# Patient Record
Sex: Female | Born: 1985 | Race: Black or African American | Hispanic: No | Marital: Single | Smoking: Never smoker
Health system: Southern US, Community
[De-identification: ages and names within clinical notes are randomized; demographics above are authoritative.]

## PROBLEM LIST (undated history)

## (undated) DIAGNOSIS — R87629 Unspecified abnormal cytological findings in specimens from vagina: Secondary | ICD-10-CM

## (undated) DIAGNOSIS — K219 Gastro-esophageal reflux disease without esophagitis: Secondary | ICD-10-CM

## (undated) DIAGNOSIS — J189 Pneumonia, unspecified organism: Secondary | ICD-10-CM

## (undated) DIAGNOSIS — R519 Headache, unspecified: Secondary | ICD-10-CM

## (undated) HISTORY — DX: Headache, unspecified: R51.9

## (undated) HISTORY — PX: LEEP: SHX91

## (undated) HISTORY — DX: Gastro-esophageal reflux disease without esophagitis: K21.9

## (undated) HISTORY — PX: WISDOM TOOTH EXTRACTION: SHX21

## (undated) HISTORY — PX: TONSILLECTOMY: SUR1361

## (undated) HISTORY — DX: Unspecified abnormal cytological findings in specimens from vagina: R87.629

## (undated) HISTORY — DX: Pneumonia, unspecified organism: J18.9

---
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
## 2004-05-06 ENCOUNTER — Emergency Department (HOSPITAL_COMMUNITY): Admission: EM | Admit: 2004-05-06 | Discharge: 2004-05-06 | Payer: Self-pay | Admitting: Emergency Medicine

## 2004-06-19 ENCOUNTER — Emergency Department (HOSPITAL_COMMUNITY): Admission: EM | Admit: 2004-06-19 | Discharge: 2004-06-19 | Payer: Self-pay | Admitting: Emergency Medicine

## 2004-08-14 ENCOUNTER — Ambulatory Visit: Payer: Self-pay | Admitting: Family Medicine

## 2004-08-14 ENCOUNTER — Ambulatory Visit: Payer: Self-pay | Admitting: *Deleted

## 2004-08-21 ENCOUNTER — Emergency Department (HOSPITAL_COMMUNITY): Admission: EM | Admit: 2004-08-21 | Discharge: 2004-08-21 | Payer: Self-pay | Admitting: Emergency Medicine

## 2004-09-18 ENCOUNTER — Ambulatory Visit: Payer: Self-pay | Admitting: Nurse Practitioner

## 2004-09-25 ENCOUNTER — Ambulatory Visit: Payer: Self-pay | Admitting: Nurse Practitioner

## 2004-09-27 ENCOUNTER — Ambulatory Visit: Payer: Self-pay | Admitting: Nurse Practitioner

## 2004-12-02 ENCOUNTER — Emergency Department (HOSPITAL_COMMUNITY): Admission: EM | Admit: 2004-12-02 | Discharge: 2004-12-02 | Payer: Self-pay | Admitting: Emergency Medicine

## 2004-12-09 ENCOUNTER — Ambulatory Visit: Payer: Self-pay | Admitting: Nurse Practitioner

## 2005-01-24 ENCOUNTER — Ambulatory Visit: Payer: Self-pay | Admitting: Nurse Practitioner

## 2005-02-04 ENCOUNTER — Inpatient Hospital Stay (HOSPITAL_COMMUNITY): Admission: AD | Admit: 2005-02-04 | Discharge: 2005-02-04 | Payer: Self-pay | Admitting: *Deleted

## 2005-02-11 ENCOUNTER — Inpatient Hospital Stay (HOSPITAL_COMMUNITY): Admission: RE | Admit: 2005-02-11 | Discharge: 2005-02-11 | Payer: Self-pay | Admitting: *Deleted

## 2005-02-24 ENCOUNTER — Inpatient Hospital Stay (HOSPITAL_COMMUNITY): Admission: AD | Admit: 2005-02-24 | Discharge: 2005-02-24 | Payer: Self-pay | Admitting: Obstetrics & Gynecology

## 2005-05-03 ENCOUNTER — Inpatient Hospital Stay (HOSPITAL_COMMUNITY): Admission: AD | Admit: 2005-05-03 | Discharge: 2005-05-03 | Payer: Self-pay | Admitting: *Deleted

## 2005-07-09 ENCOUNTER — Other Ambulatory Visit: Admission: RE | Admit: 2005-07-09 | Discharge: 2005-07-09 | Payer: Self-pay | Admitting: Obstetrics and Gynecology

## 2005-08-28 ENCOUNTER — Observation Stay (HOSPITAL_COMMUNITY): Admission: AD | Admit: 2005-08-28 | Discharge: 2005-08-29 | Payer: Self-pay | Admitting: Obstetrics and Gynecology

## 2005-09-24 ENCOUNTER — Inpatient Hospital Stay (HOSPITAL_COMMUNITY): Admission: AD | Admit: 2005-09-24 | Discharge: 2005-09-27 | Payer: Self-pay | Admitting: Obstetrics & Gynecology

## 2006-03-25 ENCOUNTER — Emergency Department (HOSPITAL_COMMUNITY): Admission: EM | Admit: 2006-03-25 | Discharge: 2006-03-25 | Payer: Self-pay | Admitting: Emergency Medicine

## 2006-08-24 ENCOUNTER — Emergency Department (HOSPITAL_COMMUNITY): Admission: EM | Admit: 2006-08-24 | Discharge: 2006-08-24 | Payer: Self-pay | Admitting: Emergency Medicine

## 2007-11-09 ENCOUNTER — Emergency Department (HOSPITAL_COMMUNITY): Admission: EM | Admit: 2007-11-09 | Discharge: 2007-11-09 | Payer: Self-pay | Admitting: Family Medicine

## 2008-03-13 ENCOUNTER — Emergency Department (HOSPITAL_COMMUNITY): Admission: EM | Admit: 2008-03-13 | Discharge: 2008-03-13 | Payer: Self-pay | Admitting: Emergency Medicine

## 2008-04-20 ENCOUNTER — Inpatient Hospital Stay (HOSPITAL_COMMUNITY): Admission: AD | Admit: 2008-04-20 | Discharge: 2008-04-21 | Payer: Self-pay | Admitting: Obstetrics & Gynecology

## 2008-04-28 ENCOUNTER — Inpatient Hospital Stay (HOSPITAL_COMMUNITY): Admission: RE | Admit: 2008-04-28 | Discharge: 2008-04-28 | Payer: Self-pay | Admitting: Obstetrics & Gynecology

## 2009-03-17 ENCOUNTER — Ambulatory Visit: Payer: Self-pay | Admitting: Advanced Practice Midwife

## 2009-03-17 ENCOUNTER — Inpatient Hospital Stay (HOSPITAL_COMMUNITY): Admission: AD | Admit: 2009-03-17 | Discharge: 2009-03-17 | Payer: Self-pay | Admitting: Obstetrics and Gynecology

## 2009-04-08 ENCOUNTER — Emergency Department (HOSPITAL_COMMUNITY): Admission: EM | Admit: 2009-04-08 | Discharge: 2009-04-08 | Payer: Self-pay | Admitting: Family Medicine

## 2009-07-10 ENCOUNTER — Inpatient Hospital Stay (HOSPITAL_COMMUNITY): Admission: AD | Admit: 2009-07-10 | Discharge: 2009-07-10 | Payer: Self-pay | Admitting: Obstetrics & Gynecology

## 2009-11-15 ENCOUNTER — Inpatient Hospital Stay (HOSPITAL_COMMUNITY): Admission: AD | Admit: 2009-11-15 | Discharge: 2009-11-15 | Payer: Self-pay | Admitting: Obstetrics and Gynecology

## 2009-11-15 ENCOUNTER — Ambulatory Visit: Payer: Self-pay | Admitting: Gynecology

## 2009-12-01 ENCOUNTER — Ambulatory Visit: Payer: Self-pay | Admitting: Gynecology

## 2009-12-01 ENCOUNTER — Inpatient Hospital Stay (HOSPITAL_COMMUNITY): Admission: AD | Admit: 2009-12-01 | Discharge: 2009-12-01 | Payer: Self-pay | Admitting: Obstetrics & Gynecology

## 2010-01-15 ENCOUNTER — Inpatient Hospital Stay (HOSPITAL_COMMUNITY): Admission: AD | Admit: 2010-01-15 | Discharge: 2010-01-15 | Payer: Self-pay | Admitting: Obstetrics and Gynecology

## 2010-02-12 ENCOUNTER — Inpatient Hospital Stay (HOSPITAL_COMMUNITY)
Admission: AD | Admit: 2010-02-12 | Discharge: 2010-02-14 | Payer: Self-pay | Source: Home / Self Care | Admitting: Obstetrics & Gynecology

## 2010-02-12 ENCOUNTER — Inpatient Hospital Stay (HOSPITAL_COMMUNITY): Admission: AD | Admit: 2010-02-12 | Discharge: 2010-02-12 | Payer: Self-pay | Admitting: Obstetrics & Gynecology

## 2010-04-20 ENCOUNTER — Encounter: Payer: Self-pay | Admitting: *Deleted

## 2010-06-11 LAB — CBC
HCT: 31.2 % — ABNORMAL LOW (ref 36.0–46.0)
HCT: 38.6 % (ref 36.0–46.0)
MCHC: 32.5 g/dL (ref 30.0–36.0)
MCHC: 33.7 g/dL (ref 30.0–36.0)
MCV: 87.8 fL (ref 78.0–100.0)
Platelets: 197 10*3/uL (ref 150–400)
RBC: 3.55 MIL/uL — ABNORMAL LOW (ref 3.87–5.11)
RDW: 15.2 % (ref 11.5–15.5)
RDW: 15.4 % (ref 11.5–15.5)
WBC: 11.8 10*3/uL — ABNORMAL HIGH (ref 4.0–10.5)

## 2010-06-13 LAB — URINALYSIS, ROUTINE W REFLEX MICROSCOPIC
Bilirubin Urine: NEGATIVE
Ketones, ur: NEGATIVE mg/dL
Nitrite: NEGATIVE
Urobilinogen, UA: 1 mg/dL (ref 0.0–1.0)

## 2010-06-14 LAB — URINALYSIS, ROUTINE W REFLEX MICROSCOPIC
Bilirubin Urine: NEGATIVE
Protein, ur: NEGATIVE mg/dL
Urobilinogen, UA: 0.2 mg/dL (ref 0.0–1.0)

## 2010-06-19 LAB — URINALYSIS, ROUTINE W REFLEX MICROSCOPIC
Bilirubin Urine: NEGATIVE
Glucose, UA: NEGATIVE mg/dL
Ketones, ur: NEGATIVE mg/dL
Nitrite: NEGATIVE
Protein, ur: NEGATIVE mg/dL

## 2010-06-19 LAB — URINE MICROSCOPIC-ADD ON

## 2010-06-19 LAB — WET PREP, GENITAL: Trich, Wet Prep: NONE SEEN

## 2010-07-01 LAB — URINALYSIS, ROUTINE W REFLEX MICROSCOPIC
Bilirubin Urine: NEGATIVE
Glucose, UA: NEGATIVE mg/dL
Hgb urine dipstick: NEGATIVE
Ketones, ur: NEGATIVE mg/dL
Nitrite: NEGATIVE
Specific Gravity, Urine: 1.015 (ref 1.005–1.030)
pH: 6.5 (ref 5.0–8.0)

## 2010-07-01 LAB — WET PREP, GENITAL

## 2010-07-15 LAB — CBC
HCT: 39.4 % (ref 36.0–46.0)
MCHC: 32.5 g/dL (ref 30.0–36.0)
MCV: 88.6 fL (ref 78.0–100.0)
WBC: 10.7 10*3/uL — ABNORMAL HIGH (ref 4.0–10.5)

## 2010-07-15 LAB — WET PREP, GENITAL: Clue Cells Wet Prep HPF POC: NONE SEEN

## 2010-08-16 NOTE — Discharge Summary (Signed)
[name], [name]               ACCOUNT NO.:  [id number]   MEDICAL RECORD NO.:  [id number]          PATIENT TYPE:  OBV   LOCATION:  9196                          FACILITY:  [facility]   PHYSICIAN:  Miguel Wilson, M.D.       DATE OF BIRTH:  22-Feb-1986   DATE OF ADMISSION:  08/28/2005  DATE OF DISCHARGE:  08/29/2005                                 DISCHARGE SUMMARY   FINAL DIAGNOSES:  1.  Intrauterine pregnancy at [redacted] weeks gestation.  2.  Decreased fetal movement.  3.  Shortness of breath.  4.  Preterm uterine contractions.   This 25 year old G1, P0 presents at [redacted] weeks gestation complaining of  decreased fetal movement and shortness of breath.  The patient had oxygen  saturations of 100% on admission.  Her lungs were clear to auscultation.  She was in no apparent distress.  She was having contractions.  Her cervix  was 1 cm dilated, 60% effaced, and -2 station.  She did have a reactive  stress test, was showing contractions about every 5-15 minutes.  She had a  BPP of 8/10, AFI of 15.6.  At this point she was just admitted for  observation for the contractions since the rest of her workup looked good.  She was started on Procardia 10 mg every 6-8 hours.  On the next hospital  day the patient was doing well.  Her contractions were suppressed with the  Procardia, she was having good fetal movement, and she was felt ready for  discharge.  She was sent home on a regular diet, told to decrease her  activities, told to use Procardia 10 mg one t.i.d. or every 8 hours, was to  follow up in our office on September 11, 2005.  Of course, to call with any  increased problems or decreased fetal movement.   LABORATORY ON DISCHARGE:  There were no labs taken but the patient did have  a negative urine upon admission.      Hannah Wilson, P.A.-C.      Miguel Wilson, M.D.  Electronically Signed    MB/MEDQ  D:  09/28/2005  T:  09/29/2005  Job:  [id number]

## 2010-08-27 IMAGING — US US OB TRANSVAGINAL MODIFY
1 series · 14 of 28 positions shown · non-contrast
Comparison: none

CLINICAL DATA: Right lower quadrant pain.  Patient 4 weeks 5 days
pregnant by last menstrual period

OBSTETRIC <14 WK US AND TRANSVAGINAL OB US
TECHNIQUE: Both transabdominal and transvaginal ultrasound
examinations were performed for complete evaluation of the
gestation as well as the maternal uterus, adnexal regions, and
pelvic cul-de-sac.

[Series 1: us ob comp less 14 wks · 14 of 36 slices shown]
[im 2/36]
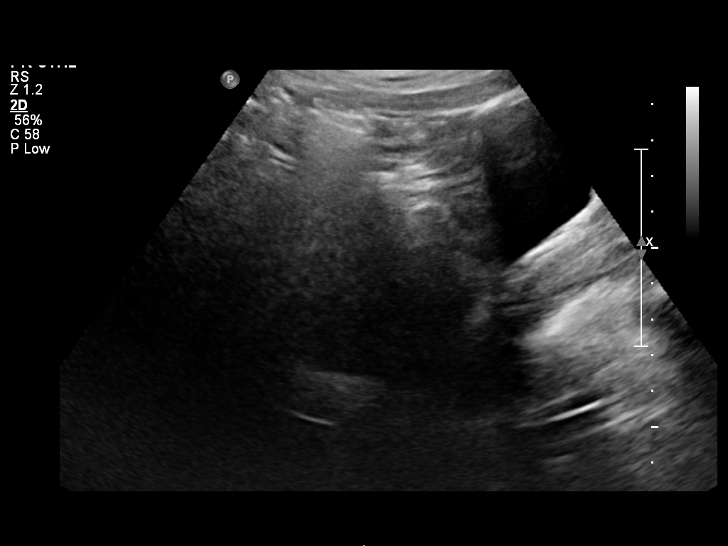
[im 4/36]
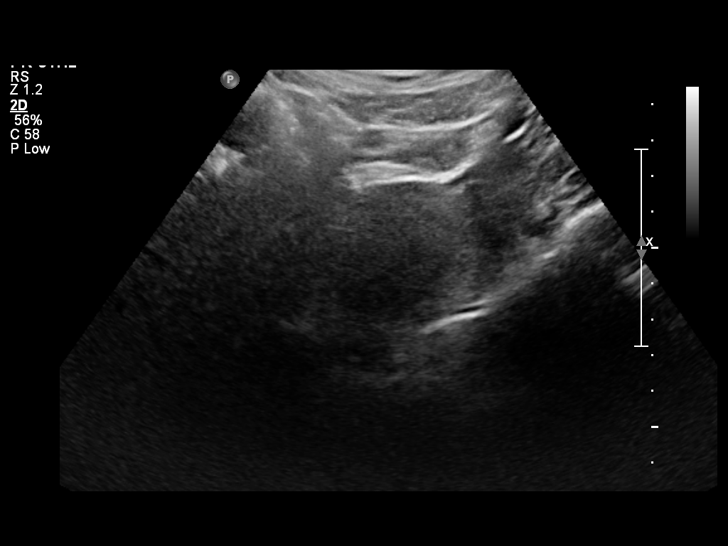
[im 7/36]
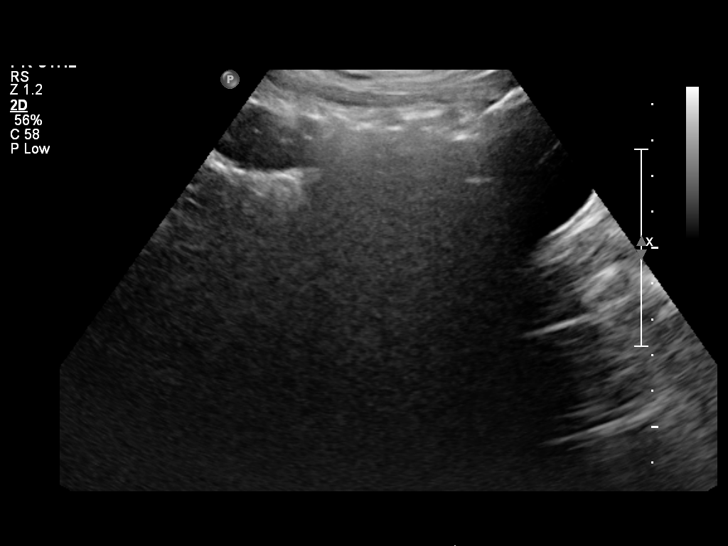
[im 10/36]
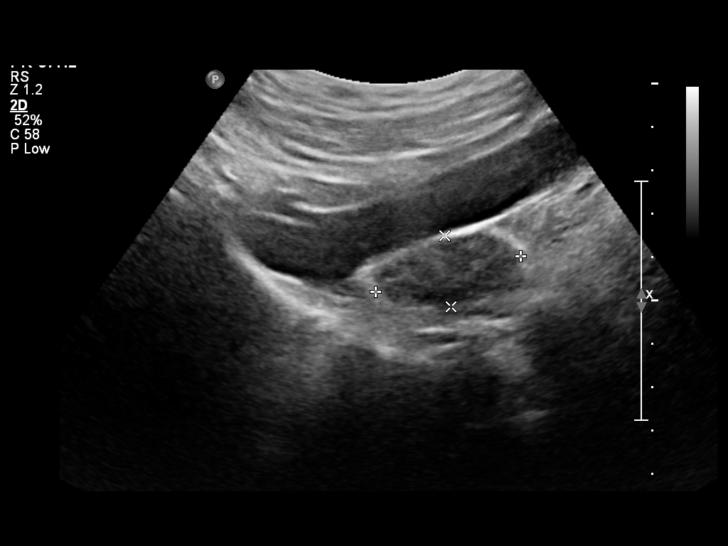
[im 12/36]
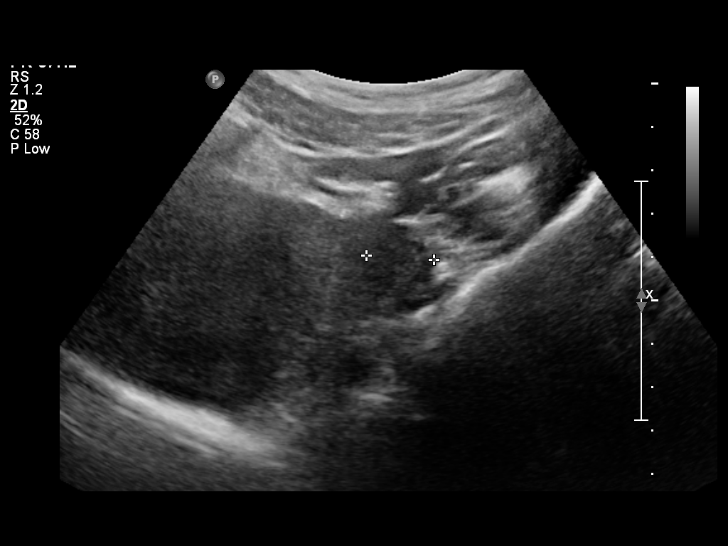
[im 15/36]
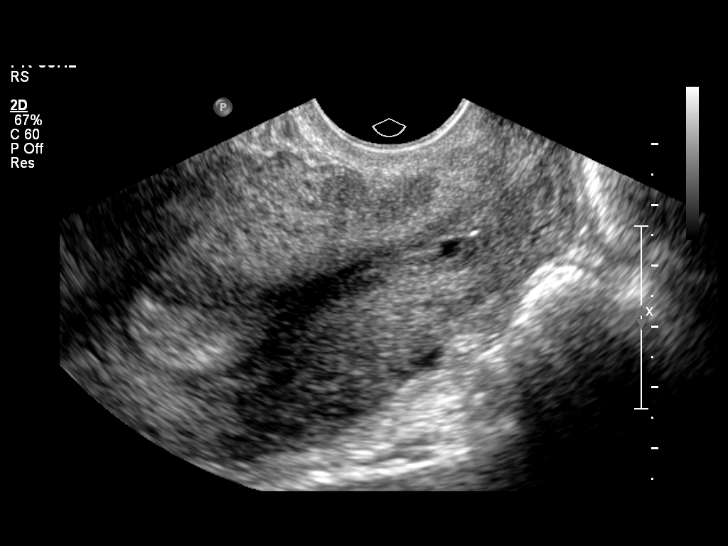
[im 17/36]
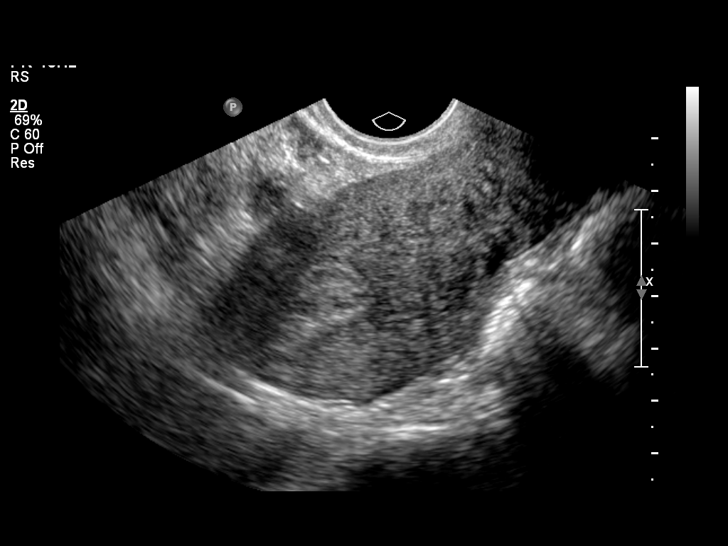
[im 20/36]
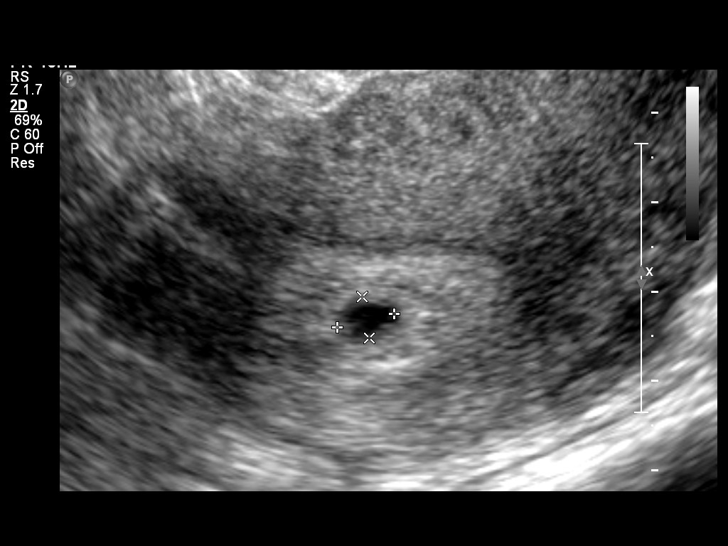
[im 23/36]
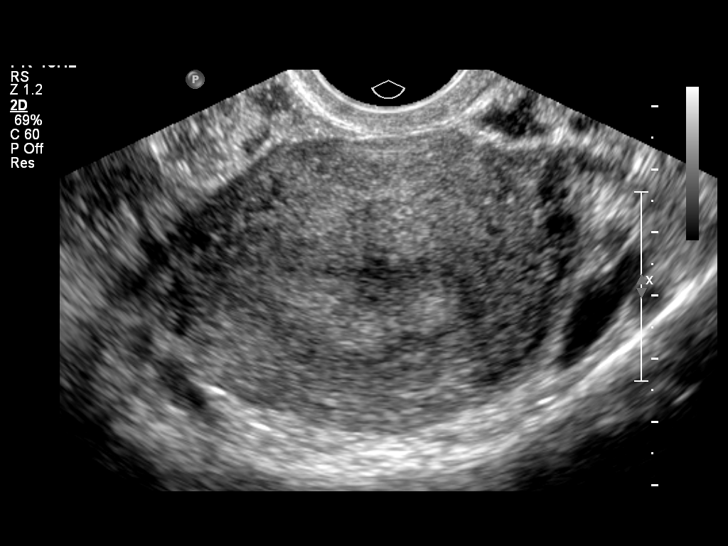
[im 25/36]
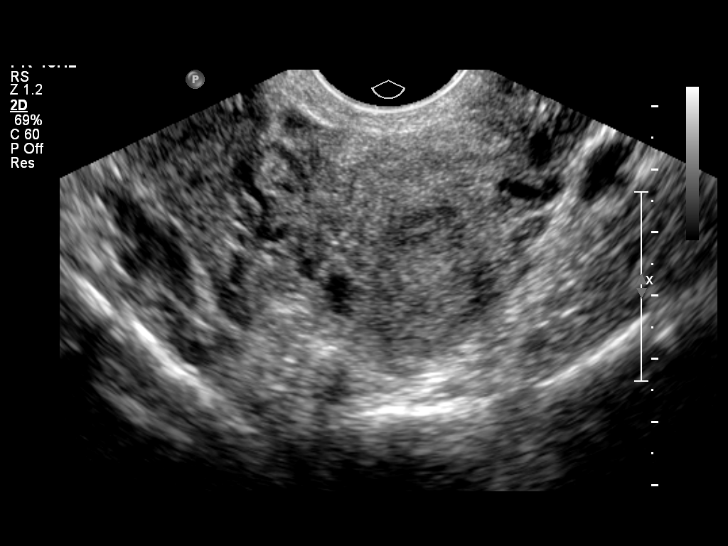
[im 28/36]
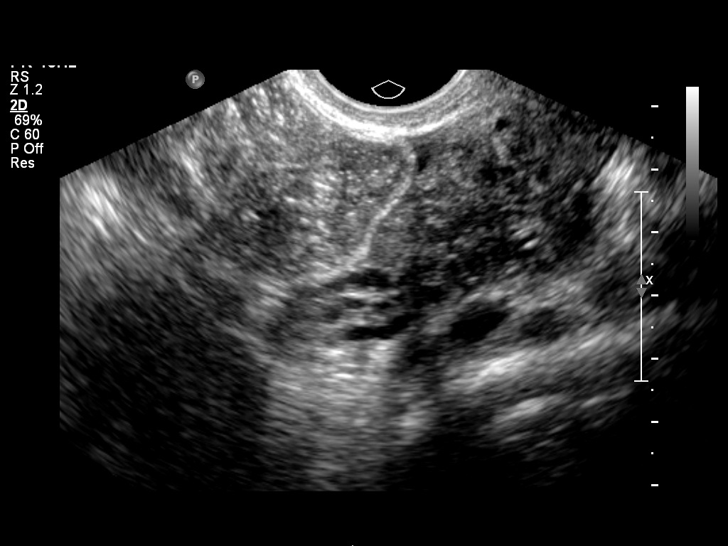
[im 30/36]
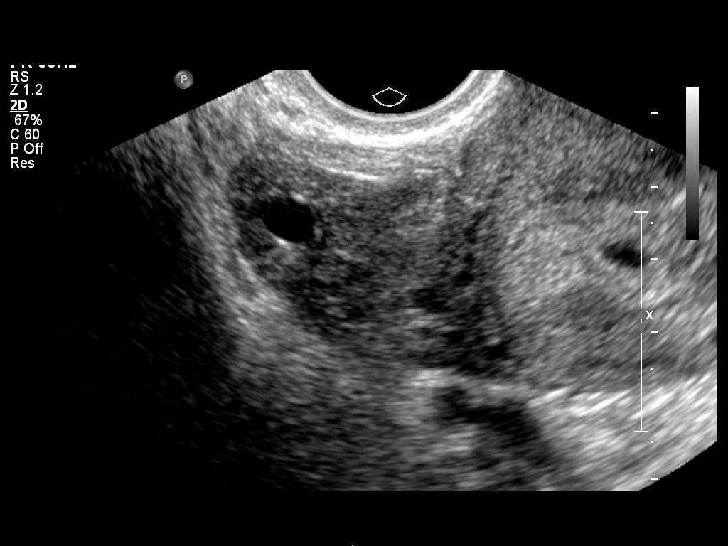
[im 33/36]
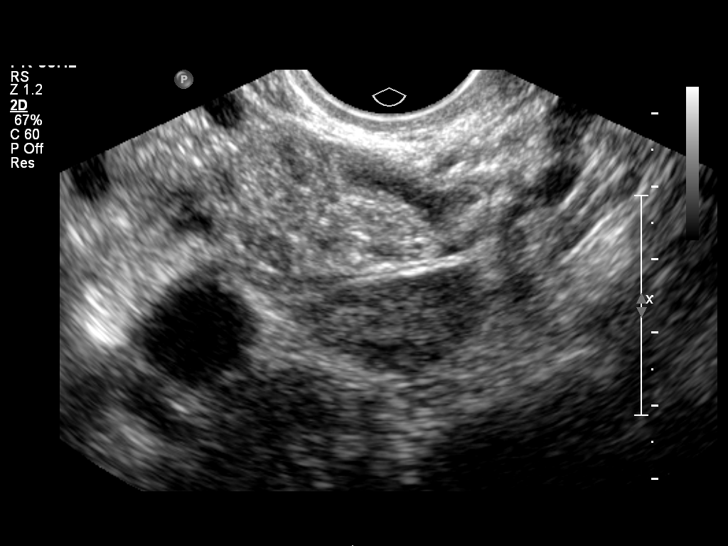
[im 36/36]
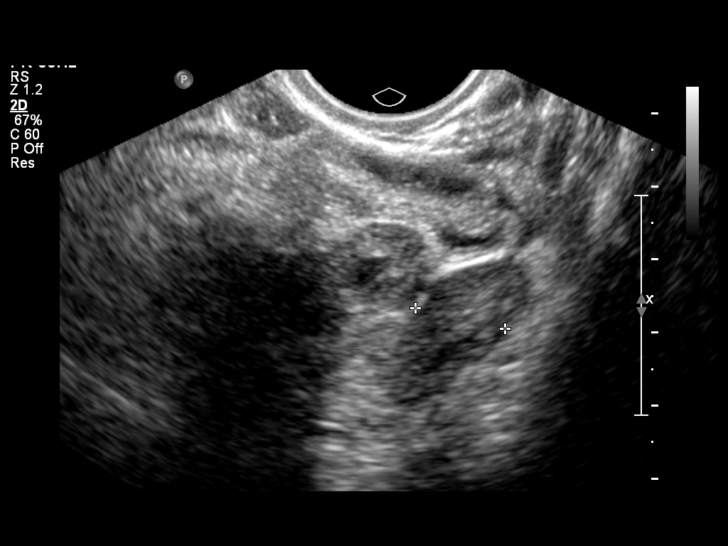

[14 of 28 positions shown; findings below may reference images not displayed]

There is a single small sac within the uterus with a likely
decidual reaction.  This is slightly asymmetric to the endometrial
canal and most consistent with a gestational sac.  No yolk sac or
embryo evident at this stage.

Mean sac diameter equals 5.9 mm for a calculated estimated
gestational age of 5 weeks 1 day.

Right ovary contains a corpus luteal cyst.  Left ovary is normal.
No subchorionic hemorrhage.  No free fluid.
IMPRESSION: 1.  Likely single intrauterine gestational sac.  Differential
diagnosis includes early intrauterine pregnancy, spontaneous
abortion in progress, or less likely pseudocyst  of ectopic
pregnancy.  Recommend follow up imaging and following beta HCGs.

2. .  Estimated gestational age by mean sac diameter equals 5 weeks
1 day.

## 2010-12-27 LAB — POCT URINALYSIS DIP (DEVICE)
Glucose, UA: NEGATIVE
Nitrite: NEGATIVE
Operator id: 29721
Protein, ur: NEGATIVE
Urobilinogen, UA: 0.2

## 2010-12-27 LAB — WET PREP, GENITAL

## 2010-12-27 LAB — GC/CHLAMYDIA PROBE AMP, GENITAL
Chlamydia, DNA Probe: NEGATIVE
GC Probe Amp, Genital: NEGATIVE

## 2011-07-31 ENCOUNTER — Encounter (HOSPITAL_COMMUNITY): Payer: Self-pay | Admitting: Emergency Medicine

## 2011-07-31 ENCOUNTER — Emergency Department (INDEPENDENT_AMBULATORY_CARE_PROVIDER_SITE_OTHER)
Admission: EM | Admit: 2011-07-31 | Discharge: 2011-07-31 | Disposition: A | Discharge status: Home / Self Care | Payer: Self-pay | Source: Home / Self Care | Attending: Emergency Medicine | Admitting: Emergency Medicine

## 2011-07-31 DIAGNOSIS — J40 Bronchitis, not specified as acute or chronic: Secondary | ICD-10-CM

## 2011-07-31 MED ORDER — AZITHROMYCIN 250 MG PO TABS: 250.0000 mg | ORAL_TABLET | Freq: Every day | ORAL | Status: AC

## 2011-07-31 MED ORDER — GUAIFENESIN-CODEINE 100-10 MG/5ML PO SYRP: 5.0000 mL | ORAL_SOLUTION | Freq: Three times a day (TID) | ORAL | Status: AC | PRN

## 2011-07-31 NOTE — ED Provider Notes (Signed)
History     CSN: [id number]  Arrival date & time 07/31/11  1036   First MD Initiated Contact with Patient 07/31/11 1207      Chief Complaint  Patient presents with  . URI    (Consider location/radiation/quality/duration/timing/severity/associated sxs/prior treatment) HPI Comments: For about 2 weeks been trying to  Shake this off" , have been c, coughing and congested for almost 2 weeks, my daughter also with an respiratory infection, has a product ecough  Patient is a 26 y.o. female presenting with URI. The history is provided by the patient.  URI The primary symptoms include fever, sore throat and cough. Primary symptoms do not include vomiting or rash. The current episode started more than 1 week ago. The problem has not changed since onset. The illness is not associated with chills, congestion or rhinorrhea.    History reviewed. No pertinent past medical history.  Past Surgical History  Procedure Date  . Tonsillectomy   . Leep     No family history on file.  History  Substance Use Topics  . Smoking status: Never Smoker   . Smokeless tobacco: Not on file  . Alcohol Use: No    OB History    Grav Para Term Preterm Abortions TAB SAB Ect Mult Living                  Review of Systems  Constitutional: Positive for fever. Negative for chills.  HENT: Positive for sore throat. Negative for congestion and rhinorrhea.   Respiratory: Positive for cough. Negative for shortness of breath.   Gastrointestinal: Negative for vomiting.  Skin: Negative for rash.    Allergies  Penicillins  Home Medications   Current Outpatient Rx  Name Route Sig Dispense Refill  . AZITHROMYCIN 250 MG PO TABS Oral Take 1 tablet (250 mg total) by mouth daily. Take first 2 tablets together, then 1 every day until finished. 6 tablet 0  . GUAIFENESIN-CODEINE 100-10 MG/5ML PO SYRP Oral Take 5 mLs by mouth 3 (three) times daily as needed for cough. 120 mL 0    BP 113/62  Pulse 109  Temp(Src)  98.8 F (37.1 C) (Oral)  Resp 20  SpO2 100%  LMP 06/16/2011  Physical Exam  Nursing note and vitals reviewed. Constitutional: She appears well-developed and well-nourished.  Non-toxic appearance. She does not have a sickly appearance. She does not appear ill. No distress.  HENT:  Head: Normocephalic.  Nose: Nose normal.  Mouth/Throat: No oropharyngeal exudate.  Neck: Neck supple.  Pulmonary/Chest: Effort normal.  Skin: No rash noted.    ED Course  Procedures (including critical care time)  Labs Reviewed - No data to display No results found.   1. Bronchitis       MDM  Afebrile, looks comfortable in no respiratory  Distress, encouraged in 2-3 days if no importance        Jimmie Molly, MD 07/31/11 [phone]

## 2011-07-31 NOTE — Discharge Instructions (Signed)
Bronchitis Bronchitis is a problem of the air tubes leading to your lungs. This problem makes it hard for air to get in and out of the lungs. You may cough a lot because your air tubes are narrow. Going without care can cause lasting (chronic) bronchitis. HOME CARE   Drink enough fluids to keep your pee (urine) clear or pale yellow.   Use a cool mist humidifier.   Quit smoking if you smoke. If you keep smoking, the bronchitis might not get better.   Only take medicine as told by your doctor.  GET HELP RIGHT AWAY IF:   Coughing keeps you awake.   You start to wheeze.   You become more sick or weak.   You have a hard time breathing or get short of breath.   You cough up blood.   Coughing lasts more than 2 weeks.   You have a fever.   Your baby is older than 3 months with a rectal temperature of 102 F (38.9 C) or higher.   Your baby is 3 months old or younger with a rectal temperature of 100.4 F (38 C) or higher.  MAKE SURE YOU:  Understand these instructions.   Will watch your condition.   Will get help right away if you are not doing well or get worse.  Document Released: 09/03/2007 Document Revised: 03/06/2011 Document Reviewed: 02/16/2009 Exit[employer] Patient Information 2012 [employer]. 

## 2011-07-31 NOTE — ED Notes (Signed)
Vitals obtained by students 

## 2011-07-31 NOTE — ED Notes (Signed)
Sore throat, body aches, cough, runny nose, green-yellow congestion.  Inially symptoms onset 2 weeks ago, and some symptoms improved, sore throat has reoccurred this am.  Reports temp 99.6 this am.

## 2021-03-31 NOTE — L&D Delivery Note (Signed)
Called to bedside due to fetal bradycardia. Upon my arrival fhr in the 70-80's with contractions return to baseline 110's .Marland Kitchen Pt is complete and +2 Station. D/W patient option of vacuum assisted delivery vs cesarean section.  Risk of vacuum delivery to include but not limited to laceration to fetal head. Hematoma formation, r/o shoulder dystocia. R/o pop offs without delivery necessitating cesarean delivery. Pt desired to proceed with delivery via vacuum extraction.  Kiwi vacuum extractor was used . During contraction pressure was applied to the green zone. While the patient was pushing traction was applied. The suction on the vacuum was released between contractions. The fetal head was successfully delivered within 3 contractions without any pop offs.   Nuchal cord x 1 was reduced. The rest of the fetal body was delivered. Vigorous cry was noted. The newborn was placed on Moms chest. Delayed cord clamping was performed. The remainder of the delivery was performed by Rhea Pink CNM. ( See separate delivery note

## 2021-03-31 NOTE — L&D Delivery Note (Signed)
Delivery Note Labor onset: 11/01/2021  Labor Onset Time: 1639 Complete dilation at  1931 Onset of pushing at 1931 FHR second stage Cat 3 Analgesia/Anesthesia intrapartum: Epidural  Variable decels to 70's with return to baseline with position change converted to terminal bradycardia. Strong maternal pushing effort resulting in fetal decent from -1 to +2 station. Bradycardia continues despite effective intrauterine resuscitation. Dr. Richardson Dopp notified for assistance. MD to bedside, pt consented for operative vaginal delivery via vacuum extraction or primary cesarean birth. Pt consents to vacuum assistance. Kiwi vacuum applied by Dr. Richardson Dopp (see MD note for details). No pop-offs. Delivery of a viable female at 30.   Nuchal cord: tight, reduced on perineum.  Infant placed on maternal abd, dried, and tactile stim.  Cord double clamped after 1 min and cut by Vaughan Basta, father.  RN  present for birth.  Cord blood sample collected: Yes Arterial cord blood sample collected: Yes 7.21  Placenta delivered Tomasa Blase, intact, with 3 VC absent of Wharton's jelly.  Placenta to L&D. Uterine tone firm, bleeding scant  No laceration identified.  QBL (mL): 102 Complications: terminal bradycardia APGAR: APGAR (1 MIN):  8 APGAR (5 MINS):  9 APGAR (10 MINS):   Mom to postpartum.  Baby to Couplet care / Skin to Skin. Baby boy "Hannah Wilson" Circumcision Yes  Roma Schanz DNP, CNM 11/01/2021, 8:03 PM

## 2021-04-29 LAB — OB RESULTS CONSOLE HEPATITIS B SURFACE ANTIGEN: Hepatitis B Surface Ag: NEGATIVE

## 2021-04-29 LAB — OB RESULTS CONSOLE RUBELLA ANTIBODY, IGM: Rubella: IMMUNE

## 2021-04-29 LAB — OB RESULTS CONSOLE GC/CHLAMYDIA
Chlamydia: NEGATIVE
Neisseria Gonorrhea: NEGATIVE

## 2021-04-29 LAB — OB RESULTS CONSOLE HIV ANTIBODY (ROUTINE TESTING): HIV: NONREACTIVE

## 2021-07-08 ENCOUNTER — Other Ambulatory Visit: Payer: Self-pay | Admitting: Obstetrics & Gynecology

## 2021-07-08 ENCOUNTER — Other Ambulatory Visit: Payer: Self-pay | Admitting: Obstetrics and Gynecology

## 2021-07-08 DIAGNOSIS — Z3689 Encounter for other specified antenatal screening: Secondary | ICD-10-CM

## 2021-07-22 ENCOUNTER — Encounter: Payer: Self-pay | Admitting: *Deleted

## 2021-07-26 ENCOUNTER — Ambulatory Visit: Payer: Managed Care, Other (non HMO) | Admitting: *Deleted

## 2021-07-26 ENCOUNTER — Ambulatory Visit: Payer: Self-pay

## 2021-07-26 ENCOUNTER — Other Ambulatory Visit: Payer: Self-pay | Admitting: Obstetrics and Gynecology

## 2021-07-26 ENCOUNTER — Ambulatory Visit (HOSPITAL_BASED_OUTPATIENT_CLINIC_OR_DEPARTMENT_OTHER): Payer: Managed Care, Other (non HMO) | Admitting: Obstetrics

## 2021-07-26 ENCOUNTER — Ambulatory Visit: Payer: Managed Care, Other (non HMO) | Attending: Obstetrics and Gynecology

## 2021-07-26 ENCOUNTER — Other Ambulatory Visit: Payer: Self-pay | Admitting: *Deleted

## 2021-07-26 VITALS — BP 108/71 | HR 106 | Ht 63.0 in

## 2021-07-26 DIAGNOSIS — O09522 Supervision of elderly multigravida, second trimester: Secondary | ICD-10-CM

## 2021-07-26 DIAGNOSIS — Z3689 Encounter for other specified antenatal screening: Secondary | ICD-10-CM

## 2021-07-26 DIAGNOSIS — Z3A24 24 weeks gestation of pregnancy: Secondary | ICD-10-CM | POA: Diagnosis not present

## 2021-07-26 DIAGNOSIS — Z9889 Other specified postprocedural states: Secondary | ICD-10-CM | POA: Diagnosis present

## 2021-07-26 DIAGNOSIS — O4442 Low lying placenta NOS or without hemorrhage, second trimester: Secondary | ICD-10-CM | POA: Diagnosis not present

## 2021-07-26 DIAGNOSIS — O4440 Low lying placenta NOS or without hemorrhage, unspecified trimester: Secondary | ICD-10-CM

## 2021-07-26 NOTE — Progress Notes (Signed)
MFM Note  Hannah Wilson was seen for a detailed fetal anatomy scan due to advanced maternal age.  The views of the fetal anatomy were unable to be fully visualized during her recent ultrasound in your office.  A low-lying placenta was also noted during that exam.  She denies any significant past medical history and denies any problems in her current pregnancy.    She had a cell free DNA test earlier in her pregnancy which indicated a low risk for trisomy 73, 9, and 13. A female fetus is predicted.  She was informed that the fetal growth and amniotic fluid level were appropriate for her gestational age.  The fetal biometry measurements obtained today are consistent with an [facility] of November 12, 2021.  There were no obvious fetal anomalies noted on today's ultrasound exam.  The patient was informed that anomalies may be missed due to technical limitations. If the fetus is in a suboptimal position or maternal habitus is increased, visualization of the fetus in the maternal uterus may be impaired.  A low-lying placenta measuring 1.9 cm away from the internal cervical os was noted today.  The patient was reassured that the placenta will most likely move away from the cervix later in her pregnancy.  She should be able to attempt a vaginal delivery at term.  The increased risk of fetal aneuploidy due to advanced maternal age was discussed. Due to advanced maternal age, the patient was offered and declined an amniocentesis today for definitive diagnosis of fetal aneuploidy.  She is comfortable with her negative cell free DNA test.  A follow-up exam was scheduled in 6 weeks to assess the placental location and the fetal growth.    The patient stated that all of her questions were answered.  A total of 30 minutes was spent counseling and coordinating the care for this patient.  Greater than 50% of the time was spent in direct face-to-face contact.

## 2021-08-23 LAB — OB RESULTS CONSOLE RPR: RPR: NONREACTIVE

## 2021-09-04 ENCOUNTER — Ambulatory Visit: Payer: Managed Care, Other (non HMO) | Admitting: *Deleted

## 2021-09-04 ENCOUNTER — Ambulatory Visit: Payer: Managed Care, Other (non HMO) | Attending: Obstetrics

## 2021-09-04 VITALS — BP 120/72 | HR 105

## 2021-09-04 DIAGNOSIS — O4440 Low lying placenta NOS or without hemorrhage, unspecified trimester: Secondary | ICD-10-CM | POA: Diagnosis present

## 2021-09-04 DIAGNOSIS — O09523 Supervision of elderly multigravida, third trimester: Secondary | ICD-10-CM | POA: Diagnosis not present

## 2021-09-04 DIAGNOSIS — Z3A30 30 weeks gestation of pregnancy: Secondary | ICD-10-CM | POA: Diagnosis not present

## 2021-11-01 ENCOUNTER — Inpatient Hospital Stay (HOSPITAL_COMMUNITY)
Admission: AD | Admit: 2021-11-01 | Discharge: 2021-11-03 | DRG: 807 | Disposition: A | Discharge status: Home / Self Care | Payer: Managed Care, Other (non HMO) | Attending: Obstetrics and Gynecology | Admitting: Obstetrics and Gynecology

## 2021-11-01 ENCOUNTER — Inpatient Hospital Stay (HOSPITAL_COMMUNITY): Payer: Managed Care, Other (non HMO) | Admitting: Anesthesiology

## 2021-11-01 ENCOUNTER — Encounter (HOSPITAL_COMMUNITY): Payer: Self-pay | Admitting: Obstetrics & Gynecology

## 2021-11-01 ENCOUNTER — Other Ambulatory Visit: Payer: Self-pay

## 2021-11-01 DIAGNOSIS — Z880 Allergy status to penicillin: Secondary | ICD-10-CM | POA: Diagnosis not present

## 2021-11-01 DIAGNOSIS — O99824 Streptococcus B carrier state complicating childbirth: Secondary | ICD-10-CM | POA: Diagnosis present

## 2021-11-01 DIAGNOSIS — O4290 Premature rupture of membranes, unspecified as to length of time between rupture and onset of labor, unspecified weeks of gestation: Secondary | ICD-10-CM

## 2021-11-01 DIAGNOSIS — Z3A38 38 weeks gestation of pregnancy: Secondary | ICD-10-CM

## 2021-11-01 DIAGNOSIS — O4292 Full-term premature rupture of membranes, unspecified as to length of time between rupture and onset of labor: Principal | ICD-10-CM | POA: Diagnosis present

## 2021-11-01 LAB — CBC
HCT: 36.9 % (ref 36.0–46.0)
Hemoglobin: 11.6 g/dL — ABNORMAL LOW (ref 12.0–15.0)
MCH: 26.2 pg (ref 26.0–34.0)
MCHC: 31.4 g/dL (ref 30.0–36.0)
MCV: 83.3 fL (ref 80.0–100.0)
Platelets: 217 10*3/uL (ref 150–400)
RBC: 4.43 MIL/uL (ref 3.87–5.11)
RDW: 15.7 % — ABNORMAL HIGH (ref 11.5–15.5)
WBC: 9.1 10*3/uL (ref 4.0–10.5)
nRBC: 0 % (ref 0.0–0.2)

## 2021-11-01 LAB — TYPE AND SCREEN
ABO/RH(D): A POS
Antibody Screen: NEGATIVE

## 2021-11-01 LAB — OB RESULTS CONSOLE GBS: GBS: POSITIVE

## 2021-11-01 LAB — POCT FERN TEST: POCT Fern Test: POSITIVE

## 2021-11-01 MED ORDER — DIPHENHYDRAMINE HCL 50 MG/ML IJ SOLN: 12.5000 mg | INTRAMUSCULAR | Status: DC | PRN

## 2021-11-01 MED ORDER — OXYCODONE-ACETAMINOPHEN 5-325 MG PO TABS: 2.0000 | ORAL_TABLET | ORAL | Status: DC | PRN

## 2021-11-01 MED ORDER — LIDOCAINE HCL (PF) 1 % IJ SOLN
INTRAMUSCULAR | Status: DC | PRN
  Administered 2021-11-01: 10 mL via EPIDURAL

## 2021-11-01 MED ORDER — WITCH HAZEL-GLYCERIN EX PADS: 1.0000 | MEDICATED_PAD | CUTANEOUS | Status: DC | PRN

## 2021-11-01 MED ORDER — SENNOSIDES-DOCUSATE SODIUM 8.6-50 MG PO TABS
2.0000 | ORAL_TABLET | ORAL | Status: DC
  Administered 2021-11-02 – 2021-11-03 (×2): 2 via ORAL
  Filled 2021-11-01 (×2): qty 2

## 2021-11-01 MED ORDER — FENTANYL CITRATE (PF) 100 MCG/2ML IJ SOLN
50.0000 ug | INTRAMUSCULAR | Status: DC | PRN
  Administered 2021-11-01: 50 ug via INTRAVENOUS
  Filled 2021-11-01: qty 2

## 2021-11-01 MED ORDER — ACETAMINOPHEN 325 MG PO TABS
650.0000 mg | ORAL_TABLET | ORAL | Status: DC | PRN
Start: 2021-11-01 — End: 2021-11-01

## 2021-11-01 MED ORDER — ZOLPIDEM TARTRATE 5 MG PO TABS: 5.0000 mg | ORAL_TABLET | Freq: Every evening | ORAL | Status: DC | PRN

## 2021-11-01 MED ORDER — VANCOMYCIN HCL IN DEXTROSE 1-5 GM/200ML-% IV SOLN
1000.0000 mg | Freq: Two times a day (BID) | INTRAVENOUS | Status: DC
Start: 2021-11-01 — End: 2021-11-01
  Administered 2021-11-01: 1000 mg via INTRAVENOUS
  Filled 2021-11-01 (×2): qty 200

## 2021-11-01 MED ORDER — PHENYLEPHRINE 80 MCG/ML (10ML) SYRINGE FOR IV PUSH (FOR BLOOD PRESSURE SUPPORT)
80.0000 ug | PREFILLED_SYRINGE | INTRAVENOUS | Status: DC | PRN
  Filled 2021-11-01: qty 10

## 2021-11-01 MED ORDER — SOD CITRATE-CITRIC ACID 500-334 MG/5ML PO SOLN: 30.0000 mL | ORAL | Status: DC | PRN

## 2021-11-01 MED ORDER — PRENATAL MULTIVITAMIN CH
1.0000 | ORAL_TABLET | Freq: Every day | ORAL | Status: DC
  Administered 2021-11-02: 1 via ORAL
  Filled 2021-11-01: qty 1

## 2021-11-01 MED ORDER — LACTATED RINGERS IV SOLN: INTRAVENOUS | Status: DC

## 2021-11-01 MED ORDER — LACTATED RINGERS IV SOLN
500.0000 mL | INTRAVENOUS | Status: DC | PRN
  Administered 2021-11-01: 1000 mL via INTRAVENOUS

## 2021-11-01 MED ORDER — DIBUCAINE (PERIANAL) 1 % EX OINT: 1.0000 | TOPICAL_OINTMENT | CUTANEOUS | Status: DC | PRN

## 2021-11-01 MED ORDER — IBUPROFEN 600 MG PO TABS
600.0000 mg | ORAL_TABLET | Freq: Four times a day (QID) | ORAL | Status: DC
  Administered 2021-11-01 – 2021-11-03 (×6): 600 mg via ORAL
  Filled 2021-11-01 (×6): qty 1

## 2021-11-01 MED ORDER — ONDANSETRON HCL 4 MG/2ML IJ SOLN
4.0000 mg | Freq: Four times a day (QID) | INTRAMUSCULAR | Status: DC | PRN
  Administered 2021-11-01: 4 mg via INTRAVENOUS
  Filled 2021-11-01: qty 2

## 2021-11-01 MED ORDER — LIDOCAINE HCL (PF) 1 % IJ SOLN: 30.0000 mL | INTRAMUSCULAR | Status: DC | PRN

## 2021-11-01 MED ORDER — DIPHENHYDRAMINE HCL 25 MG PO CAPS: 25.0000 mg | ORAL_CAPSULE | Freq: Four times a day (QID) | ORAL | Status: DC | PRN

## 2021-11-01 MED ORDER — LACTATED RINGERS IV SOLN: 500.0000 mL | Freq: Once | INTRAVENOUS | Status: DC

## 2021-11-01 MED ORDER — PHENYLEPHRINE 80 MCG/ML (10ML) SYRINGE FOR IV PUSH (FOR BLOOD PRESSURE SUPPORT): 80.0000 ug | PREFILLED_SYRINGE | INTRAVENOUS | Status: DC | PRN

## 2021-11-01 MED ORDER — FENTANYL-BUPIVACAINE-NACL 0.5-0.125-0.9 MG/250ML-% EP SOLN
12.0000 mL/h | EPIDURAL | Status: DC | PRN
  Administered 2021-11-01: 12 mL/h via EPIDURAL
  Filled 2021-11-01: qty 250

## 2021-11-01 MED ORDER — ONDANSETRON HCL 4 MG PO TABS: 4.0000 mg | ORAL_TABLET | ORAL | Status: DC | PRN

## 2021-11-01 MED ORDER — ACETAMINOPHEN 325 MG PO TABS: 650.0000 mg | ORAL_TABLET | ORAL | Status: DC | PRN

## 2021-11-01 MED ORDER — EPHEDRINE 5 MG/ML INJ: 10.0000 mg | INTRAVENOUS | Status: DC | PRN

## 2021-11-01 MED ORDER — CLINDAMYCIN PHOSPHATE 900 MG/50ML IV SOLN: 900.0000 mg | Freq: Three times a day (TID) | INTRAVENOUS | Status: DC

## 2021-11-01 MED ORDER — ONDANSETRON HCL 4 MG/2ML IJ SOLN: 4.0000 mg | INTRAMUSCULAR | Status: DC | PRN

## 2021-11-01 MED ORDER — SIMETHICONE 80 MG PO CHEW: 80.0000 mg | CHEWABLE_TABLET | ORAL | Status: DC | PRN

## 2021-11-01 MED ORDER — TETANUS-DIPHTH-ACELL PERTUSSIS 5-2.5-18.5 LF-MCG/0.5 IM SUSY: 0.5000 mL | PREFILLED_SYRINGE | Freq: Once | INTRAMUSCULAR | Status: DC

## 2021-11-01 MED ORDER — BENZOCAINE-MENTHOL 20-0.5 % EX AERO
1.0000 | INHALATION_SPRAY | CUTANEOUS | Status: DC | PRN
  Administered 2021-11-02: 1 via TOPICAL
  Filled 2021-11-01: qty 56

## 2021-11-01 MED ORDER — FLEET ENEMA 7-19 GM/118ML RE ENEM: 1.0000 | ENEMA | RECTAL | Status: DC | PRN

## 2021-11-01 MED ORDER — COCONUT OIL OIL: 1.0000 | TOPICAL_OIL | Status: DC | PRN

## 2021-11-01 MED ORDER — OXYTOCIN BOLUS FROM INFUSION
333.0000 mL | Freq: Once | INTRAVENOUS | Status: AC
  Administered 2021-11-01: 333 mL via INTRAVENOUS

## 2021-11-01 MED ORDER — OXYCODONE-ACETAMINOPHEN 5-325 MG PO TABS: 1.0000 | ORAL_TABLET | ORAL | Status: DC | PRN

## 2021-11-01 MED ORDER — OXYTOCIN-SODIUM CHLORIDE 30-0.9 UT/500ML-% IV SOLN
2.5000 [IU]/h | INTRAVENOUS | Status: DC
  Administered 2021-11-01: 2.5 [IU]/h via INTRAVENOUS
  Filled 2021-11-01: qty 500

## 2021-11-01 NOTE — Anesthesia Procedure Notes (Signed)
Epidural Patient location during procedure: OB Start time: 11/01/2021 6:30 PM End time: 11/01/2021 6:33 PM  Staffing Anesthesiologist: Leilani Able, MD Performed: anesthesiologist   Preanesthetic Checklist Completed: patient identified, IV checked, site marked, risks and benefits discussed, surgical consent, monitors and equipment checked, pre-op evaluation and timeout performed  Epidural Patient position: sitting Prep: DuraPrep and site prepped and draped Patient monitoring: continuous pulse ox and blood pressure Approach: midline Location: L3-L4 Injection technique: LOR air  Needle:  Needle type: Tuohy  Needle gauge: 17 G Needle length: 9 cm and 9 Needle insertion depth: 8 cm Catheter type: closed end flexible Catheter size: 19 Gauge Catheter at skin depth: 13 cm Test dose: negative and Other  Assessment Events: blood not aspirated, injection not painful, no injection resistance, paresthesia and negative IV test  Additional Notes R leg X 1Reason for block:procedure for pain

## 2021-11-01 NOTE — Lactation Note (Signed)
This note was copied from a baby's chart. Lactation Consultation Note  Patient Name: Boy Channon Brougher NIDPO'E Date: 11/01/2021 Reason for consult: L&D Initial assessment Age:37 hours Birth Parent latched infant on her left breast using the football hold position, infant latched with depth and was still breastfeeding after 13 minutes when LC left the room. Birth Parent knows to BF infant according to hunger cues, on demand, 8 to 12+ times within 24 hours, STS. Birth Parent understands if infant is still cuing after BF on 1st breast to offer the 2nd breast within the same feeding. Birth Parent knows to ask RN. LC for further latch assistance if needed on MBU.  Maternal Data  Per Birth Parent 2nd child briefly BF for 3 weeks stopped due to latch difficulties .   Feeding Mother's Current Feeding Choice: Breast Milk  LATCH Score Latch: Grasps breast easily, tongue down, lips flanged, rhythmical sucking.  Audible Swallowing: Spontaneous and intermittent  Type of Nipple: Everted at rest and after stimulation  Comfort (Breast/Nipple): Soft / non-tender  Hold (Positioning): Assistance needed to correctly position infant at breast and maintain latch.  LATCH Score: 9   Lactation Tools Discussed/Used    Interventions    Discharge    Consult Status Consult Status: Follow-up from L&D    Danelle Earthly 11/01/2021, 8:56 PM

## 2021-11-01 NOTE — MAU Note (Signed)
.  Hannah Wilson is a 36 y.o. at [redacted]w[redacted]d here in MAU reporting: ctx that started this morning but are now every 10 minutes apart. States they are more intense and got worse this afternoon. Denies VB or LOF. Endorses good fetal movement.   Pain score: 8

## 2021-11-01 NOTE — H&P (Signed)
OB ADMISSION HISTORY & PHYSICAL  Admission Date: 11/01/2021  4:16 PM  Admit Diagnosis: PROM  Hannah Wilson is a 36 y.o. female [id number] [redacted]w[redacted]d presenting for painful regular contractions that started today. Endorses active FM, denies vaginal bleeding. Her water broke in the MAU.   History of current pregnancy: [id number]   Prenatal Care with: [facility] (transfer from [facility] at 14 wks) [facility] 11/12/21 by LMP11/2/23 and congruent w/ 9 wk U/S.   Anatomy scan:  20 wks, complete w/ posterior placenta.    Significant prenatal problems: AMA Chronic migraines History of 3 miscarriages Allergy to PCN   Prenatal Labs: ABO, Rh:  A pos Antibody:  neg Rubella:   Immune RPR:   NR HBsAg:   Neg HIV:   neg 1 HR GCT: Pass  GBS:   Pos GC/CHL: Negative  Genetics: Low risk female Vaccines: Tdap: Y Flu Y Covid: Y  Prenatal Transfer Tool  Maternal Diabetes: No Genetic Screening: Normal Maternal Ultrasounds/Referrals: Normal Fetal Ultrasounds or other Referrals:  None Maternal Substance Abuse:  No Significant Maternal Medications:  None Significant Maternal Lab Results:  Group B Strep positive Other Comments:  None  OB History  Gravida Para Term Preterm AB Living  6 2 2   3 2   SAB IAB Ectopic Multiple Live Births    3     2    # Outcome Date GA Lbr Len/2nd Weight Sex Delivery Anes PTL Lv  6 Current           5 Term 02/12/10    M Vag-Spont   LIV  4 Term 09/25/05    F Vag-Spont   LIV  3 IAB           2 IAB           1 IAB             Medical / Surgical History: Past medical history:  Past Medical History:  Diagnosis Date   GERD (gastroesophageal reflux disease)    Headache    Pneumonia    Vaginal Pap smear, abnormal     Past surgical history:  Past Surgical History:  Procedure Laterality Date   LEEP     TONSILLECTOMY     Family History:  Family History  Problem Relation Age of Onset   Hypertension Mother    Asthma Mother    Stroke Sister    Arthritis Maternal Grandmother     Hypertension Maternal Grandmother    Heart disease Maternal Grandmother    Cancer Paternal Grandfather     Social History:  reports that she has never smoked. She does not have any smokeless tobacco history on file. She reports that she does not drink alcohol and does not use drugs.  Allergies: Penicillins   Current Medications at time of admission:  Prior to Admission medications   Medication Sig Start Date End Date Taking? Authorizing Provider  famotidine (PEPCID) 20 MG tablet Take 20 mg by mouth 2 (two) times daily.   Yes [provider]  Prenatal Vit-Fe Fumarate-FA (PRENATAL MULTIVITAMIN) TABS tablet Take 1 tablet by mouth daily at 12 noon.   Yes [provider]  polyethylene glycol (MIRALAX / GLYCOLAX) 17 g packet Take 17 g by mouth daily.    [provider]  Psyllium (METAMUCIL PO) Take by mouth.    [provider]    Review of Systems: Constitutional: Negative   HENT: Negative   Eyes: Negative   Respiratory: Negative   Cardiovascular:  Negative   Gastrointestinal: Negative  Genitourinary: neg for bloody show, pos for LOF   Musculoskeletal: Negative   Skin: Negative   Neurological: Negative   Endo/Heme/Allergies: Negative   Psychiatric/Behavioral: Negative    Physical Exam Done by MAU provider: VS: Blood pressure 119/84, pulse (!) 104, temperature 98.3 F (36.8 C), temperature source Oral, resp. rate 14, weight 94.8 kg, last menstrual period 02/05/2021, SpO2 100 %. AAO x3, no signs of distress GU/GI: Abdomen gravid, non-tender, non-distended, active FM, vertex Extremities: No edema, negative for pain, tenderness, and cords  Cervical exam:Dilation: 1 Effacement (%): 100 Station: -2 Exam by:: Holly Flippin RN FHR: baseline rate 130 / variability moderate / accelerations present / absent decelerations TOCO: irregular  Most recent growth ultrasound 10/08/21: 2686 grams 5# 9 oz       Assessment: Hannah Wilson [redacted]w[redacted]d  admitted for PROM  1st stage of labor FHR category 1 GBS Pos Pain management plan: labor support   Plan:  Admit to Labor and Delivery Routine admission orders Epidural on maternal demand IV hydration Continuous monitoring Pitocin for labor induction / augmentation IV Vancomycin for GBS prophylaxis Anticipate NSVD delivery   Essie Hart MD 11/01/2021 5:00 PM

## 2021-11-01 NOTE — Anesthesia Preprocedure Evaluation (Addendum)
Anesthesia Evaluation  Patient identified by MRN, date of birth, ID band Patient awake    Reviewed: Allergy & Precautions, H&P , Patient's Chart, lab work & pertinent test results  Airway Mallampati: I       Dental no notable dental hx.    Pulmonary    Pulmonary exam normal        Cardiovascular negative cardio ROS Normal cardiovascular exam     Neuro/Psych  Headaches, negative psych ROS   GI/Hepatic Neg liver ROS, GERD  Medicated and Controlled,  Endo/Other  negative endocrine ROS  Renal/GU negative Renal ROS  negative genitourinary   Musculoskeletal negative musculoskeletal ROS (+)   Abdominal (+) + obese,   Peds  Hematology negative hematology ROS (+)   Anesthesia Other Findings   Reproductive/Obstetrics (+) Pregnancy                             Anesthesia Physical Anesthesia Plan  ASA: 1  Anesthesia Plan: Epidural   Post-op Pain Management:    Induction:   PONV Risk Score and Plan:   Airway Management Planned:   Additional Equipment:   Intra-op Plan:   Post-operative Plan:   Informed Consent: I have reviewed the patients History and Physical, chart, labs and discussed the procedure including the risks, benefits and alternatives for the proposed anesthesia with the patient or authorized representative who has indicated his/her understanding and acceptance.       Plan Discussed with:   Anesthesia Plan Comments:         Anesthesia Quick Evaluation

## 2021-11-01 NOTE — MAU Provider Note (Signed)
Ms. Hannah Wilson is a 36 y.o. [id number] female at [redacted]w[redacted]d weeks gestation presenting to MAU with complaints of contractions. MAU provider was requested by RN to verify presentation.  NST - FHR: 130 bpm / moderate variability / accels present / decels absent / TOCO: regular every 3-4 mins   Patient informed that the ultrasound is considered a limited OB ultrasound and is not intended to be a complete ultrasound exam.  Patient also informed that the ultrasound is not being completed with the intent of assessing for fetal or placental anomalies or any pelvic abnormalities.  Explained that the purpose of today's ultrasound is to assess for presentation.  Baby was found to be in a cephalic presentation. Patient acknowledges the purpose of the exam and the limitations of the study.  Raelyn Mora, CNM  11/01/2021 4:50 PM

## 2021-11-02 LAB — CBC
HCT: 28.4 % — ABNORMAL LOW (ref 36.0–46.0)
Hemoglobin: 9.2 g/dL — ABNORMAL LOW (ref 12.0–15.0)
MCH: 26.7 pg (ref 26.0–34.0)
MCHC: 32.4 g/dL (ref 30.0–36.0)
MCV: 82.3 fL (ref 80.0–100.0)
Platelets: 180 10*3/uL (ref 150–400)
RBC: 3.45 MIL/uL — ABNORMAL LOW (ref 3.87–5.11)
RDW: 15.4 % (ref 11.5–15.5)
WBC: 13.1 10*3/uL — ABNORMAL HIGH (ref 4.0–10.5)
nRBC: 0 % (ref 0.0–0.2)

## 2021-11-02 LAB — RPR: RPR Ser Ql: NONREACTIVE

## 2021-11-02 MED ORDER — IBUPROFEN 600 MG PO TABS: 600.0000 mg | ORAL_TABLET | Freq: Four times a day (QID) | ORAL | 0 refills | Status: AC | PRN

## 2021-11-02 MED ORDER — ACETAMINOPHEN 325 MG PO TABS: 650.0000 mg | ORAL_TABLET | ORAL | Status: AC | PRN

## 2021-11-02 NOTE — Lactation Note (Signed)
This note was copied from a baby's chart. Lactation Consultation Note  Patient Name: Hannah Wilson IDCVU'D Date: 11/02/2021   Age:37 hours  LC attempted to visit with the birthing parent and the infant, but the RN was in the room. Will follow up later.   Maternal Data    Feeding    LATCH Score                    Lactation Tools Discussed/Used    Interventions    Discharge    Consult Status      Delene Loll 11/02/2021, 8:14 AM

## 2021-11-02 NOTE — Anesthesia Postprocedure Evaluation (Signed)
Anesthesia Post Note  Patient: Hannah Wilson  Procedure(s) Performed: AN AD HOC LABOR EPIDURAL     Patient location during evaluation: Mother Baby Anesthesia Type: Epidural Level of consciousness: awake and alert and oriented Pain management: satisfactory to patient Vital Signs Assessment: post-procedure vital signs reviewed and stable Respiratory status: respiratory function stable Cardiovascular status: stable Postop Assessment: no headache, no backache, epidural receding, patient able to bend at knees, no signs of nausea or vomiting, adequate PO intake and able to ambulate Anesthetic complications: no   No notable events documented.  Last Vitals:  Vitals:   11/02/21 0302 11/02/21 0803  BP: (!) 94/55 (!) 103/55  Pulse: 75 60  Resp: 16 18  Temp: 36.7 C 36.7 C  SpO2: 98%     Last Pain:  Vitals:   11/02/21 1212  TempSrc:   PainSc: 0-No pain   Pain Goal:                   Taneesha Edgin

## 2021-11-02 NOTE — Lactation Note (Signed)
This note was copied from a baby's chart. Lactation Consultation Note  Patient Name: Hannah Wilson NOMVE'H Date: 11/02/2021 Reason for consult: Initial assessment;Early term 37-38.6wks;Breastfeeding assistance Age:37 hours  P3, Early Term, Infant Female  LC entered the room and baby was asleep in the bassinet. Per the birthing parent things are going well with breastfeeding. She states that she breast fed 11 years ago, but did go into detail about her journey. She states that she is open to assistance this time.   LC demonstrated how to hand express.   LC spoke with the parents about feeding cues, feeding frequency, cluster feeding, milk production, supply and demand, and infant behavior.   According to the birthing parent, she has been feeling some discomfort with feeding.   LC encouraged the birthing parent to call for latch assistance when baby is ready to feed.   The parents stated that they have no questions or concerns.   Current Feeding Plan:  Breastfeed/ Chest feed according to feeding cues 8+ times in 24 hours.  Hand express for added stimulation and spoon feed expressed milk to baby.  Call RN/LC for assistance with breast/chest feeding.   Maternal Data Has patient been taught Hand Expression?: Yes Does the patient have breastfeeding experience prior to this delivery?: Yes  Feeding Mother's Current Feeding Choice: Breast Milk  Interventions Interventions: Breast feeding basics reviewed;Hand express;Breast massage;Education;LC Services brochure  Discharge Pump: Personal;DEBP  Consult Status      Hannah Wilson 11/02/2021, 8:43 AM

## 2021-11-02 NOTE — Progress Notes (Signed)
PPD# 1 VAVD w/ intact perineum Information for the patient's newborn:  Hannah Wilson, Hannah Wilson [[id number]]  female   Baby Name Jace Circumcision Yes   S:   Reports feeling good Tolerating PO fluid and solids No nausea or vomiting Bleeding is light, no clots Pain controlled with  PO meds Up ad lib / ambulatory / voiding w/o difficulty Feeding: Bottle and Breast    O:   VS: BP (!) 94/55 (BP Location: Left Arm)   Pulse 75   Temp 98.1 F (36.7 C) (Oral)   Resp 16   Ht 5\' 3"  (1.6 m)   Wt 94.8 kg   LMP 02/05/2021   SpO2 98%   Breastfeeding Unknown   BMI 37.02 kg/m   LABS:  Recent Labs    11/01/21 1707 11/02/21 0438  WBC 9.1 13.1*  HGB 11.6* 9.2*  PLT 217 180   Blood type: --/--/A POS (08/04 1707) Rubella: Immune (01/30 0000)                      I&O: Intake/Output      08/04 0701 08/05 0700 08/05 0701 08/06 0700   Urine (mL/kg/hr) 550    Blood 102    Total Output 652    Net -652         Breastfed 1 x    Urine Occurrence 1 x      Physical Exam: Alert and oriented X3 Lungs: Clear and unlabored Heart: regular rate and rhythm / no mumurs Abdomen: soft, non-tender, non-distended  Fundus: firm, non-tender, U-2 Perineum: intact, nonedematous Lochia: appropriate Extremities: trace edema, no calf pain, tenderness, or cords    A:  PPD # 1  Normal exam  P:  Routine post partum orders Encourage ambulation Anticipate D/C on 11/03/21  Plan reviewed w/ Dr. 01/03/22, DNP, CNM 11/02/2021, 7:20 AM

## 2021-11-03 NOTE — Discharge Summary (Signed)
Postpartum Discharge Summary  Date of Service updated 11/03/2021     Patient Name: Hannah Wilson DOB: May 02, 1985 MRN: [id number]  Date of admission: 11/01/2021 Delivery date:11/01/2021  Delivering provider: Christophe Louis  Date of discharge: 11/03/2021  Admitting diagnosis: Normal labor and delivery [O80] Intrauterine pregnancy: [redacted]w[redacted]d    Secondary diagnosis:  Principal Problem:   Vacuum-assisted vaginal delivery Active Problems:   Normal labor and delivery  Additional problems: None    Discharge diagnosis: Term Pregnancy Delivered                                              Post partum procedures: None Augmentation: N/A Complications: None  Hospital course: Onset of Labor With Vaginal Delivery      36y.o. yo [id number] admitted in Active Labor on 11/01/2021. Patient had an uncomplicated labor course as follows:  Membrane Rupture Time/Date: 4:39 PM ,11/01/2021   Delivery Method:Vaginal, Vacuum (Extractor)  Episiotomy: None  Lacerations:  None  Patient had an uncomplicated postpartum course.  She is ambulating, tolerating a regular diet, passing flatus, and urinating well. Patient is discharged home in stable condition on 11/03/21.  Newborn Data: Birth date:11/01/2021  Birth time:7:45 PM  Gender:Female  Living status:Living  Apgars:8 ,9  Weight:3400 g   Magnesium Sulfate received: No BMZ received: No Rhophylac:N/A MMR:N/A T-DaP: Unknown Flu: N/A Transfusion:No  Physical exam  Vitals:   11/02/21 0803 11/02/21 1500 11/02/21 2121 11/03/21 0550  BP: (!) 103/55 100/60 102/69 101/67  Pulse: 60 62 86 70  Resp: 18  17 17   Temp: 98 F (36.7 C) 98.1 F (36.7 C) 98.5 F (36.9 C) 97.9 F (36.6 C)  TempSrc: Oral Axillary Oral Oral  SpO2:   100% 99%  Weight:      Height:       General: alert, cooperative, and no distress Lochia: appropriate Uterine Fundus: firm Incision: N/A DVT Evaluation: No evidence of DVT seen on physical exam. Labs: Lab Results   Component Value Date   WBC 13.1 (H) 11/02/2021   HGB 9.2 (L) 11/02/2021   HCT 28.4 (L) 11/02/2021   MCV 82.3 11/02/2021   PLT 180 11/02/2021       No data to display         Edinburgh Score:    11/01/2021   11:20 PM  Edinburgh Postnatal Depression Scale Screening Tool  I have been able to laugh and see the funny side of things. 0  I have looked forward with enjoyment to things. 0  I have blamed myself unnecessarily when things went wrong. 0  I have been anxious or worried for no good reason. 0  I have felt scared or panicky for no good reason. 0  Things have been getting on top of me. 1  I have been so unhappy that I have had difficulty sleeping. 0  I have felt sad or miserable. 0  I have been so unhappy that I have been crying. 0  The thought of harming myself has occurred to me. 0  Edinburgh Postnatal Depression Scale Total 1      After visit meds:  Allergies as of 11/03/2021       Reactions   Penicillins Hives        Medication List     TAKE these medications    acetaminophen 325 MG tablet  Commonly known as: Tylenol Take 2 tablets (650 mg total) by mouth every 4 (four) hours as needed (for pain scale < 4).   famotidine 20 MG tablet Commonly known as: PEPCID Take 20 mg by mouth 2 (two) times daily.   ibuprofen 600 MG tablet Commonly known as: ADVIL Take 1 tablet (600 mg total) by mouth every 6 (six) hours as needed.   METAMUCIL PO Take by mouth.   polyethylene glycol 17 g packet Commonly known as: MIRALAX / GLYCOLAX Take 17 g by mouth daily.   prenatal multivitamin Tabs tablet Take 1 tablet by mouth daily at 12 noon.         Discharge home in stable condition Infant Feeding: Breast Infant Disposition:home with mother Discharge instruction: per After Visit Summary and Postpartum booklet. Activity: Advance as tolerated. Pelvic rest for 6 weeks.  Diet: routine diet Anticipated Birth Control: Unsure Postpartum Appointment:6  weeks Additional Postpartum F/U:  None Future Appointments:No future appointments. Follow up Visit:  [facility]. Schedule an appointment as soon as possible for a visit in 6 week(s).   Specialty: Obstetrics and Gynecology Contact information: [address] [place] [address] [phone]                    11/03/2021 Christophe Louis, MD

## 2021-11-09 ENCOUNTER — Telehealth (HOSPITAL_COMMUNITY): Payer: Self-pay

## 2021-11-09 NOTE — Telephone Encounter (Signed)
Patient did not answer phone call. Voicemail left for patient.   Marcelino Duster [facility] 11/09/2021,1734
